# Patient Record
Sex: Female | Born: 2000 | Race: White | Hispanic: No | Marital: Single | Smoking: Never smoker
Health system: Southern US, Community
[De-identification: ages and names within clinical notes are randomized; demographics above are authoritative.]

## PROBLEM LIST (undated history)

## (undated) HISTORY — PX: JOINT REPLACEMENT: SHX530

---

## 2019-01-06 ENCOUNTER — Other Ambulatory Visit: Payer: Self-pay | Admitting: *Deleted

## 2019-01-06 DIAGNOSIS — Z20822 Contact with and (suspected) exposure to covid-19: Secondary | ICD-10-CM

## 2019-01-07 LAB — NOVEL CORONAVIRUS, NAA: SARS-CoV-2, NAA: NOT DETECTED

## 2019-07-31 ENCOUNTER — Ambulatory Visit
Admission: RE | Admit: 2019-07-31 | Discharge: 2019-07-31 | Disposition: A | Discharge status: Home / Self Care | Payer: PRIVATE HEALTH INSURANCE | Source: Ambulatory Visit | Attending: Sports Medicine | Admitting: Sports Medicine

## 2019-07-31 ENCOUNTER — Other Ambulatory Visit: Payer: Self-pay | Admitting: Sports Medicine

## 2019-07-31 ENCOUNTER — Other Ambulatory Visit: Payer: Self-pay

## 2019-07-31 DIAGNOSIS — R609 Edema, unspecified: Secondary | ICD-10-CM

## 2019-07-31 DIAGNOSIS — R52 Pain, unspecified: Secondary | ICD-10-CM

## 2019-07-31 DIAGNOSIS — M25571 Pain in right ankle and joints of right foot: Secondary | ICD-10-CM | POA: Diagnosis present

## 2020-04-20 HISTORY — PX: ANKLE ARTHROSCOPY: SHX545

## 2021-02-22 ENCOUNTER — Other Ambulatory Visit: Payer: Self-pay

## 2021-02-22 DIAGNOSIS — N390 Urinary tract infection, site not specified: Secondary | ICD-10-CM | POA: Insufficient documentation

## 2021-02-22 DIAGNOSIS — R109 Unspecified abdominal pain: Secondary | ICD-10-CM | POA: Diagnosis present

## 2021-02-22 LAB — CBC
HCT: 36.1 % (ref 36.0–46.0)
Hemoglobin: 12.2 g/dL (ref 12.0–15.0)
MCH: 29.5 pg (ref 26.0–34.0)
MCHC: 33.8 g/dL (ref 30.0–36.0)
MCV: 87.2 fL (ref 80.0–100.0)
Platelets: 291 10*3/uL (ref 150–400)
RBC: 4.14 MIL/uL (ref 3.87–5.11)
RDW: 12.5 % (ref 11.5–15.5)
WBC: 12.1 10*3/uL — ABNORMAL HIGH (ref 4.0–10.5)
nRBC: 0 % (ref 0.0–0.2)

## 2021-02-22 NOTE — ED Triage Notes (Signed)
Patient c/o lower abdominal pain, constant urge to urinate, and some constipation (last BM today).

## 2021-02-23 ENCOUNTER — Emergency Department: Payer: 59

## 2021-02-23 ENCOUNTER — Emergency Department
Admission: EM | Admit: 2021-02-23 | Discharge: 2021-02-23 | Disposition: A | Discharge status: Home / Self Care | Payer: 59 | Attending: Emergency Medicine | Admitting: Emergency Medicine

## 2021-02-23 DIAGNOSIS — R109 Unspecified abdominal pain: Secondary | ICD-10-CM

## 2021-02-23 DIAGNOSIS — N390 Urinary tract infection, site not specified: Secondary | ICD-10-CM

## 2021-02-23 LAB — URINALYSIS, ROUTINE W REFLEX MICROSCOPIC
Bilirubin Urine: NEGATIVE
Glucose, UA: NEGATIVE mg/dL
Ketones, ur: NEGATIVE mg/dL
Nitrite: NEGATIVE
Protein, ur: 100 mg/dL — AB
RBC / HPF: >50 RBC/hpf — ABNORMAL HIGH (ref 0–5)
Specific Gravity, Urine: 1.015 (ref 1.005–1.030)
WBC, UA: >50 WBC/hpf — ABNORMAL HIGH (ref 0–5)
pH: 7 (ref 5.0–8.0)

## 2021-02-23 LAB — COMPREHENSIVE METABOLIC PANEL
ALT: 15 U/L (ref 0–44)
AST: 23 U/L (ref 15–41)
Albumin: 3.8 g/dL (ref 3.5–5.0)
Alkaline Phosphatase: 37 U/L — ABNORMAL LOW (ref 38–126)
Anion gap: 9 (ref 5–15)
BUN: 13 mg/dL (ref 6–20)
CO2: 25 mmol/L (ref 22–32)
Calcium: 9.5 mg/dL (ref 8.9–10.3)
Chloride: 104 mmol/L (ref 98–111)
Creatinine, Ser: 0.77 mg/dL (ref 0.44–1.00)
GFR, Estimated: >60 mL/min (ref >60)
Glucose, Bld: 101 mg/dL — ABNORMAL HIGH (ref 70–99)
Potassium: 4 mmol/L (ref 3.5–5.1)
Sodium: 138 mmol/L (ref 135–145)
Total Bilirubin: 0.4 mg/dL (ref 0.3–1.2)
Total Protein: 7.7 g/dL (ref 6.5–8.1)

## 2021-02-23 LAB — POC URINE PREG, ED: Preg Test, Ur: NEGATIVE

## 2021-02-23 LAB — LIPASE, BLOOD: Lipase: 35 U/L (ref 11–51)

## 2021-02-23 MED ORDER — PHENAZOPYRIDINE HCL 95 MG PO TABS: 95.0000 mg | ORAL_TABLET | Freq: Three times a day (TID) | ORAL | 0 refills | Status: DC | PRN

## 2021-02-23 MED ORDER — IBUPROFEN 800 MG PO TABS: 800.0000 mg | ORAL_TABLET | Freq: Three times a day (TID) | ORAL | 0 refills | Status: DC | PRN

## 2021-02-23 MED ORDER — KETOROLAC TROMETHAMINE 30 MG/ML IJ SOLN
30.0000 mg | Freq: Once | INTRAMUSCULAR | Status: AC
  Administered 2021-02-23: 30 mg via INTRAVENOUS
  Filled 2021-02-23: qty 1

## 2021-02-23 MED ORDER — SODIUM CHLORIDE 0.9 % IV BOLUS (SEPSIS)
1000.0000 mL | Freq: Once | INTRAVENOUS | Status: AC
  Administered 2021-02-23: 1000 mL via INTRAVENOUS

## 2021-02-23 MED ORDER — IOHEXOL 300 MG/ML  SOLN: 80.0000 mL | Freq: Once | INTRAMUSCULAR | Status: DC | PRN

## 2021-02-23 MED ORDER — SODIUM CHLORIDE 0.9 % IV SOLN
1.0000 g | Freq: Once | INTRAVENOUS | Status: AC
  Administered 2021-02-23: 1 g via INTRAVENOUS
  Filled 2021-02-23: qty 10

## 2021-02-23 MED ORDER — ONDANSETRON 4 MG PO TBDP: 4.0000 mg | ORAL_TABLET | Freq: Four times a day (QID) | ORAL | 0 refills | Status: DC | PRN

## 2021-02-23 MED ORDER — IOHEXOL 300 MG/ML  SOLN
80.0000 mL | Freq: Once | INTRAMUSCULAR | Status: AC | PRN
  Administered 2021-02-23: 80 mL via INTRAVENOUS

## 2021-02-23 MED ORDER — TRAMADOL HCL 50 MG PO TABS: 50.0000 mg | ORAL_TABLET | Freq: Four times a day (QID) | ORAL | 0 refills | Status: AC | PRN

## 2021-02-23 MED ORDER — CEPHALEXIN 500 MG PO CAPS: 500.0000 mg | ORAL_CAPSULE | Freq: Three times a day (TID) | ORAL | 0 refills | Status: AC

## 2021-02-23 NOTE — ED Notes (Signed)
Patient transported to CT 

## 2021-02-23 NOTE — Discharge Instructions (Addendum)
You may alternate Tylenol 1000 mg every 6 hours as needed for pain, fever and Ibuprofen 800 mg every 8 hours as needed for pain, fever.  Please take Ibuprofen with food.  Do not take more than 4000 mg of Tylenol (acetaminophen) in a 24 hour period.   You are being provided a prescription for opiates (also known as narcotics) for pain control.  Opiates can be addictive and should only be used when absolutely necessary for pain control when other alternatives do not work.  We recommend you only use them for the recommended amount of time and only as prescribed.  Please do not take with other sedative medications or alcohol.  Please do not drive, operate machinery, make important decisions while taking opiates.  Please note that these medications can be addictive and have high abuse potential.  Patients can become addicted to narcotics after only taking them for a few days.  Please keep these medications locked away from children, teenagers or any family members with history of substance abuse.  Narcotic pain medicine may also make you constipated.  You may use over-the-counter medications such as MiraLAX, Colace to prevent constipation.  If you become constipated you may use over-the-counter enemas as needed.  Itching and nausea are common side effects of narcotic pain medication.  If you develop uncontrolled vomiting or a rash, please stop these medications.    Steps to find a Primary Care Provider (PCP):  Call [phone] or [phone] to access "[place] Find a Doctor Service."  2.  You may also go on the [facility] website at [email or web address]

## 2021-02-23 NOTE — ED Provider Notes (Signed)
[facility] Emergency Department Provider Note  ____________________________________________   Event Date/Time   First MD Initiated Contact with Patient 02/23/21 0601     (approximate)  I have reviewed the triage vital signs and the nursing notes.   HISTORY  Chief Complaint Abdominal Pain    HPI Lindsey Ryan is a 20 y.o. female with no significant past medical history who presents to the emergency department with complaints of intermittent, sharp and severe right sided abdominal pain.  Symptoms started yesterday.  She denies any fevers, nausea, vomiting, diarrhea.  She has had some dysuria.  No current vaginal bleeding or discharge.  No previous abdominal surgeries.  No history of kidney stones.        No past medical history on file.  There are no problems to display for this patient.   Past Surgical History:  Procedure Laterality Date   JOINT REPLACEMENT      Prior to Admission medications   Medication Sig Start Date End Date Taking? Authorizing Provider  cephALEXin (KEFLEX) 500 MG capsule Take 1 capsule (500 mg total) by mouth 3 (three) times daily for 7 days. 02/23/21 03/02/21 Yes Cashmere Dingley N, DO  ibuprofen (ADVIL) 800 MG tablet Take 1 tablet (800 mg total) by mouth every 8 (eight) hours as needed for mild pain. 02/23/21  Yes Artha Stavros N, DO  ondansetron (ZOFRAN ODT) 4 MG disintegrating tablet Take 1 tablet (4 mg total) by mouth every 6 (six) hours as needed for nausea or vomiting. 02/23/21  Yes Lavanda Nevels, Layla Maw, DO  phenazopyridine (PYRIDIUM) 95 MG tablet Take 1 tablet (95 mg total) by mouth 3 (three) times daily as needed for pain. 02/23/21  Yes Armida Vickroy, Layla Maw, DO  traMADol (ULTRAM) 50 MG tablet Take 1 tablet (50 mg total) by mouth every 6 (six) hours as needed. 02/23/21 02/23/22 Yes Ohanna Gassert, Layla Maw, DO    Allergies Patient has no known allergies.  No family history on file.  Social History Social History   Tobacco Use    Smoking status: Never   Smokeless tobacco: Never  Substance Use Topics   Alcohol use: Yes   Drug use: Never    Review of Systems Constitutional: No fever. Eyes: No visual changes. ENT: No sore throat. Cardiovascular: Denies chest pain. Respiratory: Denies shortness of breath. Gastrointestinal: No nausea, vomiting, diarrhea. Genitourinary: + for dysuria. Musculoskeletal: Negative for back pain. Skin: Negative for rash. Neurological: Negative for focal weakness or numbness.  ____________________________________________   PHYSICAL EXAM:  VITAL SIGNS: ED Triage Vitals  Enc Vitals Group     BP 02/22/21 2335 132/77     Pulse Rate 02/22/21 2335 86     Resp 02/22/21 2335 17     Temp 02/22/21 2335 98.8 F (37.1 C)     Temp Source 02/22/21 2335 Oral     SpO2 02/22/21 2335 97 %     Weight 02/22/21 2336 154 lb 5.2 oz (70 kg)     Height 02/22/21 2336 5\' 11"  (1.803 m)     Head Circumference --      Peak Flow --      Pain Score 02/22/21 2335 8     Pain Loc --      Pain Edu? --      Excl. in GC? --    CONSTITUTIONAL: Alert and oriented and responds appropriately to questions. Well-appearing; well-nourished HEAD: Normocephalic EYES: Conjunctivae clear, pupils appear equal, EOM appear intact ENT: normal nose; moist mucous membranes NECK: Supple, normal  ROM CARD: RRR; S1 and S2 appreciated; no murmurs, no clicks, no rubs, no gallops RESP: Normal chest excursion without splinting or tachypnea; breath sounds clear and equal bilaterally; no wheezes, no rhonchi, no rales, no hypoxia or respiratory distress, speaking full sentences ABD/GI: Normal bowel sounds; non-distended; soft, Tender to palpation in the right mid and lower abdomen without guarding or rebound BACK: The back appears normal EXT: Normal ROM in all joints; no deformity noted, no edema; no cyanosis SKIN: Normal color for age and race; warm; no rash on exposed skin NEURO: Moves all extremities equally PSYCH: The patient's  mood and manner are appropriate.  ____________________________________________   LABS (all labs ordered are listed, but only abnormal results are displayed)  Labs Reviewed  COMPREHENSIVE METABOLIC PANEL - Abnormal; Notable for the following components:      Result Value   Glucose, Bld 101 (*)    Alkaline Phosphatase 37 (*)    All other components within normal limits  CBC - Abnormal; Notable for the following components:   WBC 12.1 (*)    All other components within normal limits  URINALYSIS, ROUTINE W REFLEX MICROSCOPIC - Abnormal; Notable for the following components:   Color, Urine YELLOW (*)    APPearance CLOUDY (*)    Hgb urine dipstick MODERATE (*)    Protein, ur 100 (*)    Leukocytes,Ua LARGE (*)    RBC / HPF >50 (*)    WBC, UA >50 (*)    Bacteria, UA RARE (*)    All other components within normal limits  URINE CULTURE  LIPASE, BLOOD  POC URINE PREG, ED   ____________________________________________  EKG   ____________________________________________  RADIOLOGY I, Thanos Cousineau, personally viewed and evaluated these images (plain radiographs) as part of my medical decision making, as well as reviewing the written report by the radiologist.  ED MD interpretation: Normal appendix.  No kidney stone or pyelonephritis.  Official radiology report(s): CT ABDOMEN PELVIS W CONTRAST  Result Date: 02/23/2021 CLINICAL DATA:  20 year old female with acute lower abdominal pain. Urinary urgency. EXAM: CT ABDOMEN AND PELVIS WITH CONTRAST TECHNIQUE: Multidetector CT imaging of the abdomen and pelvis was performed using the standard protocol following bolus administration of intravenous contrast. CONTRAST:  12mL OMNIPAQUE IOHEXOL 300 MG/ML  SOLN COMPARISON:  None. FINDINGS: Lower chest: Negative. Hepatobiliary: Negative liver and gallbladder. Pancreas: Negative. Spleen: Negative. Adrenals/Urinary Tract: Normal adrenal glands. Kidneys appear symmetric and normal. No nephrolithiasis or  pararenal inflammation. Proximal ureters are decompressed. Unremarkable bladder. Stomach/Bowel: No dilated large or small bowel. Redundant transverse colon and mild retained stool throughout the large bowel. Normal appendix visible on coronal image 34. Negative adjacent terminal ileum. Fluid-filled but nondilated small bowel in the pelvis. Decompressed stomach and duodenum. No free air, free fluid, mesenteric inflammation. Vascular/Lymphatic: Major arterial structures in the abdomen and pelvis and the portal venous system appear to be patent and normal. No lymphadenopathy. Reproductive: Within normal limits. Other: No pelvic free fluid. Musculoskeletal: Negative. IMPRESSION: Negative. Normal appendix. No acute or inflammatory process identified in the abdomen or pelvis. Electronically Signed   By: Odessa Fleming M.D.   On: 02/23/2021 07:20    ____________________________________________   PROCEDURES  Procedure(s) performed (including Critical Care):  Procedures   ____________________________________________   INITIAL IMPRESSION / ASSESSMENT AND PLAN / ED COURSE  As part of my medical decision making, I reviewed the following data within the electronic MEDICAL RECORD NUMBER Nursing notes reviewed and incorporated, Labs reviewed , Old chart reviewed, CT reviewed, and  Notes from prior ED visits         Patient here with complaints of right-sided abdominal pain.  Labs obtained from triage show mild leukocytosis of 12,000.  She does have a grossly infected urine.  Culture is pending.  Discussed with patient that symptoms could be due to a sending UTI but also concern for possible pyelonephritis, infected kidney stone as well as appendicitis based on her exam.  We will proceed with CT of the abdomen pelvis for further evaluation.  Will give IV fluids, Toradol, ceftriaxone.  ED PROGRESS  Patient CT scan shows no appendicitis, pyelonephritis, kidney stone.  She reports feeling better and is hemodynamically  stable.  Will discharge with prescriptions for ibuprofen, Azo, Keflex.  I feel she is safe for discharge home.   At this time, I do not feel there is any life-threatening condition present. I have reviewed, interpreted and discussed all results (EKG, imaging, lab, urine as appropriate) and exam findings with patient/family. I have reviewed nursing notes and appropriate previous records.  I feel the patient is safe to be discharged home without further emergent workup and can continue workup as an outpatient as needed. Discussed usual and customary return precautions. Patient/family verbalize understanding and are comfortable with this plan.  Outpatient follow-up has been provided as needed. All questions have been answered.  ____________________________________________   FINAL CLINICAL IMPRESSION(S) / ED DIAGNOSES  Final diagnoses:  Right sided abdominal pain  Acute UTI     ED Discharge Orders          Ordered    ibuprofen (ADVIL) 800 MG tablet  Every 8 hours PRN        02/23/21 0731    phenazopyridine (PYRIDIUM) 95 MG tablet  3 times daily PRN        02/23/21 0731    ondansetron (ZOFRAN ODT) 4 MG disintegrating tablet  Every 6 hours PRN        02/23/21 0731    cephALEXin (KEFLEX) 500 MG capsule  3 times daily        02/23/21 0731    traMADol (ULTRAM) 50 MG tablet  Every 6 hours PRN        02/23/21 0731            *Please note:  Livy Hertz-Saebboe was evaluated in Emergency Department on 02/23/2021 for the symptoms described in the history of present illness. She was evaluated in the context of the global COVID-19 pandemic, which necessitated consideration that the patient might be at risk for infection with the SARS-CoV-2 virus that causes COVID-19. Institutional protocols and algorithms that pertain to the evaluation of patients at risk for COVID-19 are in a state of rapid change based on information released by regulatory bodies including the CDC and federal and state  organizations. These policies and algorithms were followed during the patient's care in the ED.  Some ED evaluations and interventions may be delayed as a result of limited staffing during and the pandemic.*   Note:  This document was prepared using Dragon voice recognition software and may include unintentional dictation errors.    Jasier Calabretta, Layla Maw, DO 02/23/21 [phone]

## 2021-02-25 LAB — URINE CULTURE: Culture: >=100000 — AB

## 2022-05-18 ENCOUNTER — Encounter: Payer: Self-pay | Admitting: Medical

## 2022-05-18 ENCOUNTER — Ambulatory Visit (INDEPENDENT_AMBULATORY_CARE_PROVIDER_SITE_OTHER): Payer: BC Managed Care – PPO | Admitting: Medical

## 2022-05-18 ENCOUNTER — Other Ambulatory Visit: Payer: Self-pay

## 2022-05-18 VITALS — BP 103/73 | HR 83 | Temp 98.1°F | Ht 70.08 in | Wt 163.0 lb

## 2022-05-18 DIAGNOSIS — Z124 Encounter for screening for malignant neoplasm of cervix: Secondary | ICD-10-CM | POA: Diagnosis not present

## 2022-05-18 DIAGNOSIS — Z113 Encounter for screening for infections with a predominantly sexual mode of transmission: Secondary | ICD-10-CM

## 2022-05-18 NOTE — Progress Notes (Signed)
[address] [place], [place] [address] Phone: [phone] Fax: [phone]   Office Visit Note  Patient Name: Lindsey Ryan  Date of Birth:04-28-2000  Med Rec number [id number]  Date of Service: 05/18/2022  Allergies: Patient has no known allergies.  Chief Complaint  Patient presents with   Gynecologic Exam     HPI 22 y.o. college student presents for pap smear and STI screening.  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  Completed an at-home vaginal self swab in December provided by [employer].  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  Has been sexually active with males in past, has not always used condoms. Recalls receiving HPV vaccines as an adolescent. Would also like STI screening today.  She is not sexually active currently.  Patient denies other concerns today. Gets regular periods, no birth control currently.  Fhx of breast cancer in [place].  No family history of other gynecological cancer.   Current Medication:  Outpatient Encounter Medications as of 05/18/2022  Medication Sig   [DISCONTINUED] ibuprofen (ADVIL) 800 MG tablet Take 1 tablet (800 mg total) by mouth every 8 (eight) hours as needed for mild pain. (Patient not taking: Reported on 05/18/2022)   [DISCONTINUED] ondansetron (ZOFRAN ODT) 4 MG disintegrating tablet Take 1 tablet (4 mg total) by mouth every 6 (six) hours as needed for nausea or vomiting. (Patient not taking: Reported on 05/18/2022)   [DISCONTINUED] phenazopyridine (PYRIDIUM) 95 MG tablet Take 1 tablet (95 mg total) by mouth 3 (three) times daily as needed for pain. (Patient not taking: Reported on 05/18/2022)   No facility-administered encounter medications on file as of 05/18/2022.      Medical History: History reviewed. No pertinent past medical history.  Past Surgical History:  Procedure Laterality Date    ANKLE ARTHROSCOPY Right 04/2020     Vital Signs: BP 103/73   Pulse 83   Temp 98.1 F (36.7 C) (Tympanic)   Ht 5' 10.08" (1.78 m)   Wt 163 lb (73.9 kg)   SpO2 98%   BMI 23.34 kg/m    Review of Systems  Genitourinary:  Negative for dysuria, flank pain, frequency, menstrual problem, pelvic pain, urgency, vaginal discharge and vaginal pain.    Physical Exam Vitals reviewed. Exam conducted with a chaperone present.  Constitutional:      General: She is not in acute distress.    Appearance: She is not ill-appearing.  Genitourinary:    General: Normal vulva.     Exam position: Lithotomy position.     Labia:        Right: No rash, tenderness or lesion.        Left: No rash, tenderness or lesion.      Vagina: Vaginal discharge (small white, physiologic appearing) present. No erythema, tenderness, bleeding or lesions.     Cervix: Friability (slightly) and erythema (slight) present. No discharge, lesion or cervical bleeding.  Neurological:     Mental Status: She is alert.     Assessment/Plan: 1. Encounter for Papanicolaou smear for cervical cancer screening 2. Screening examination for STD (sexually transmitted disease) Pap smear done. Will also test sample for Chlamydia and Gonorrhea. Blood drawn for HIV and Syphilis screening. Patient encouraged to register for MyChart so she can receive copies of her results.  - Pap IG and Chlamydia/Gonococcus, NAA - HIV Antibody (routine testing w rflx) - RPR Qual  General Counseling: [name] verbalizes understanding of the findings of todays visit and agrees with plan of treatment. she has been encouraged to call the office with any questions or concerns that should arise related to todays visit.   Time spent: [place] PA-C [place] 05/18/2022 5:24 PM

## 2022-05-18 NOTE — Patient Instructions (Addendum)
-  You will receive a MyChart message notifying you of your lab results when they are available.  -Remember to use condoms every time you have sex to prevent sexually transmitted infections and pregnancy.

## 2022-05-19 LAB — RPR QUALITATIVE: RPR Ser Ql: NONREACTIVE

## 2022-05-19 LAB — HIV ANTIBODY (ROUTINE TESTING W REFLEX): HIV Screen 4th Generation wRfx: NONREACTIVE

## 2022-05-20 LAB — PAP IG AND CT-NG NAA
Chlamydia, Nuc. Acid Amp: NEGATIVE
Gonococcus by Nucleic Acid Amp: NEGATIVE

## 2022-07-08 IMAGING — CT CT ABD-PELV W/ CM
2 of 4 series · 16 of 46 positions shown, 18 images · IV contrast (APPLIED)
Comparison: None.

CLINICAL DATA: 20-year-old female with acute lower abdominal pain.
Urinary urgency.

EXAM:
CT ABDOMEN AND PELVIS WITH CONTRAST
TECHNIQUE: Multidetector CT imaging of the abdomen and pelvis was performed
using the standard protocol following bolus administration of
intravenous contrast.
CONTRAST:  80mL OMNIPAQUE IOHEXOL 300 MG/ML  SOLN

[Series 2: routine abd/pel with · axial · 0.70mm/px · z∈[-1041,-601]mm · 13 of 96 slices shown, 15 images]
[im 4/96  soft-tissue]
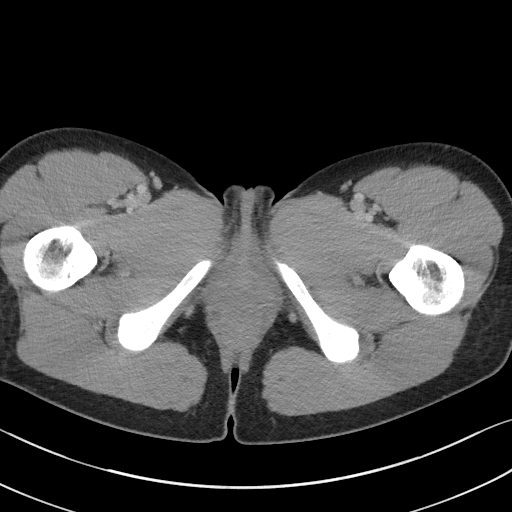
[im 4/96  bone]
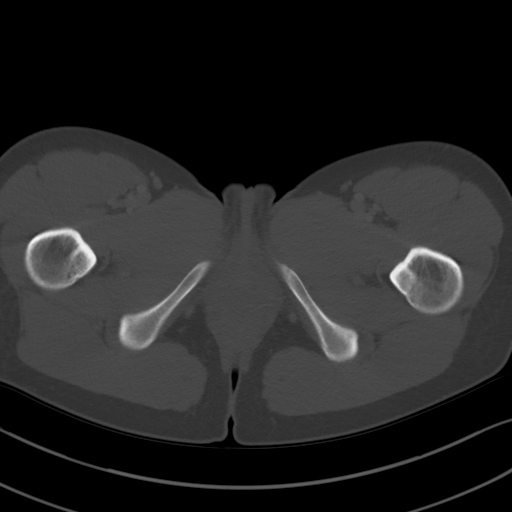
[im 12/96  soft-tissue]
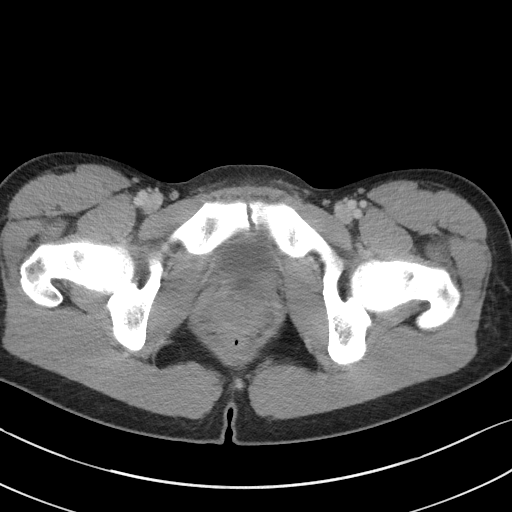
[im 20/96  soft-tissue]
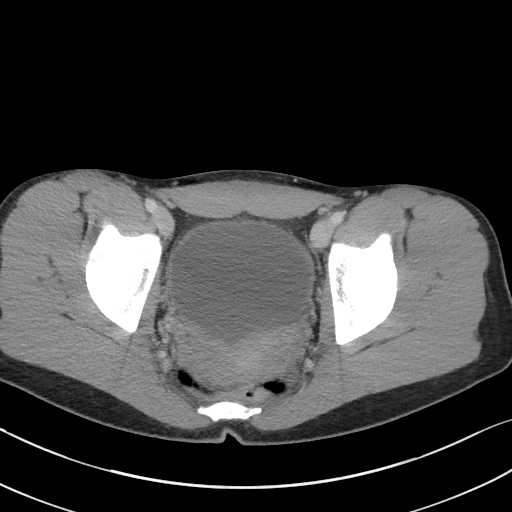
[im 28/96  soft-tissue]
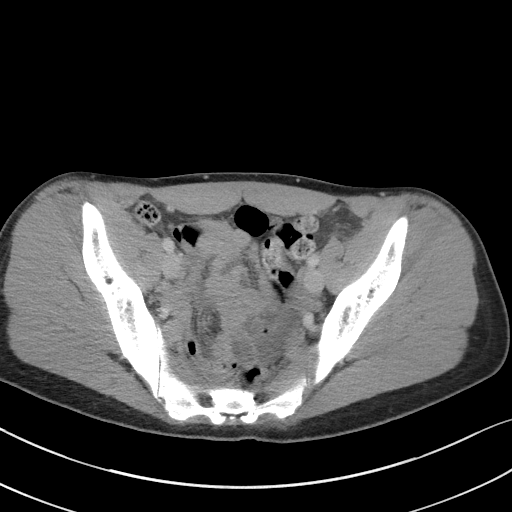
[im 32/96  soft-tissue]
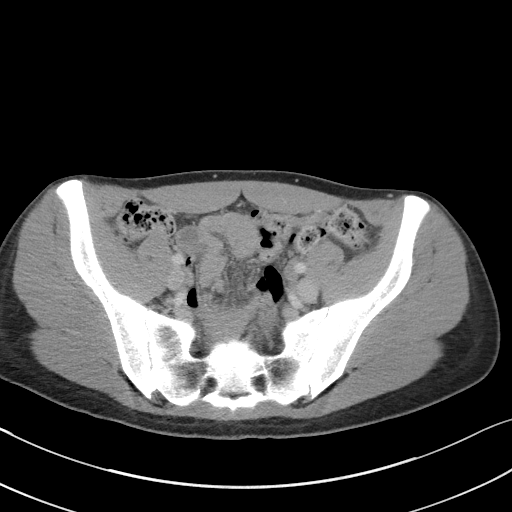
[im 40/96  soft-tissue]
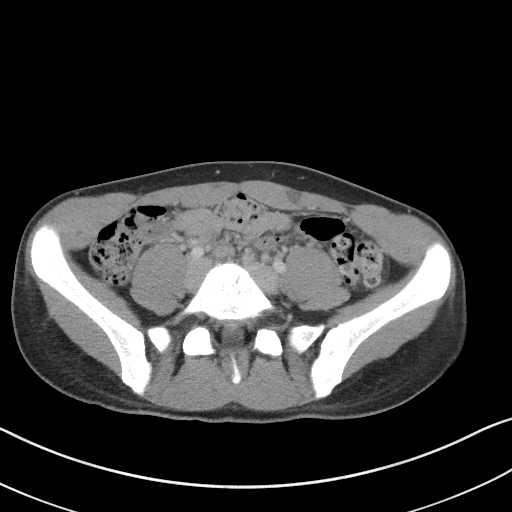
[im 48/96  soft-tissue]
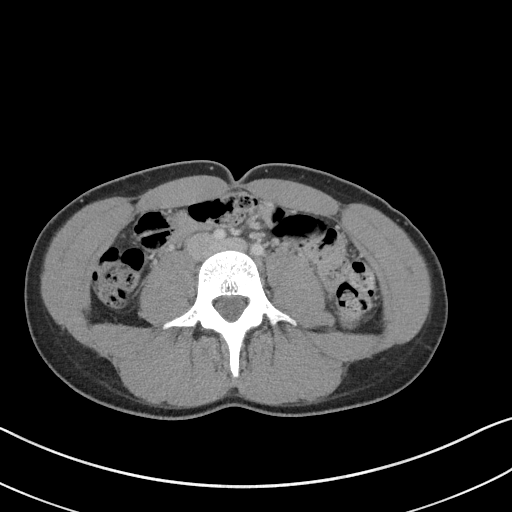
[im 56/96  soft-tissue]
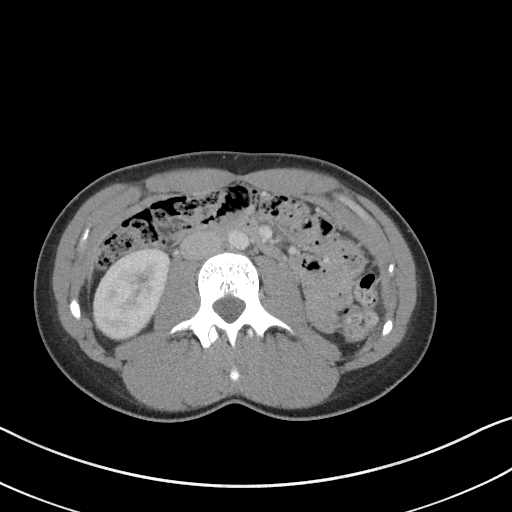
[im 64/96  soft-tissue]
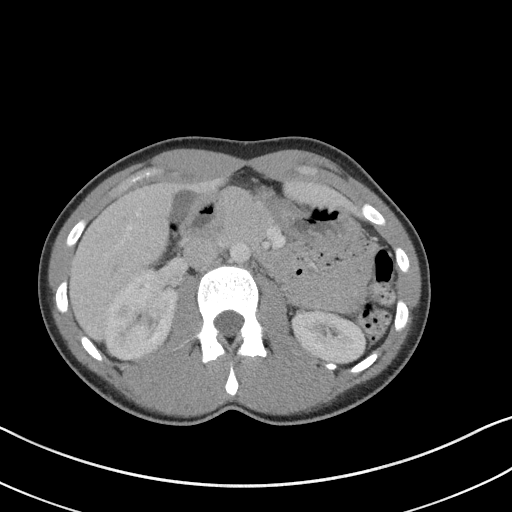
[im 64/96  bone]
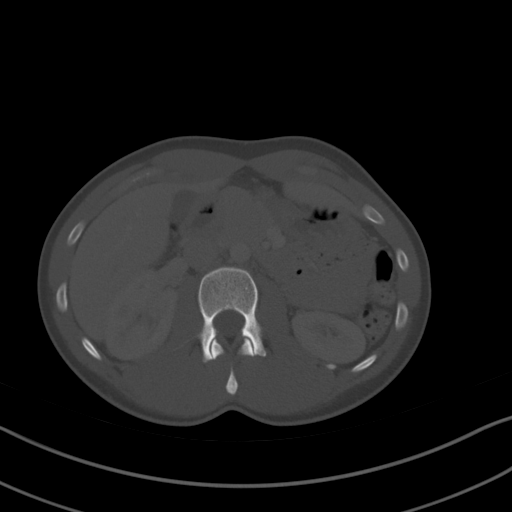
[im 68/96  soft-tissue]
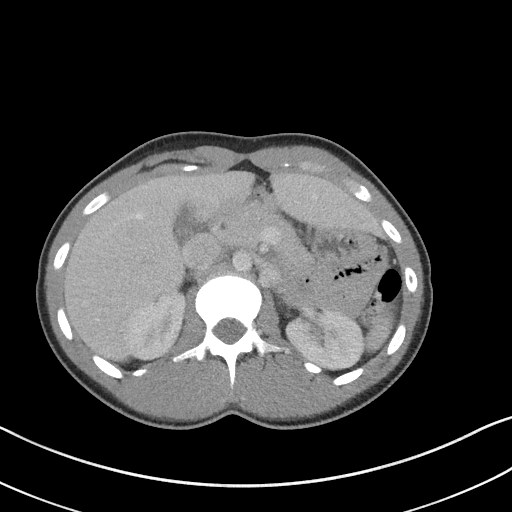
[im 76/96  soft-tissue]
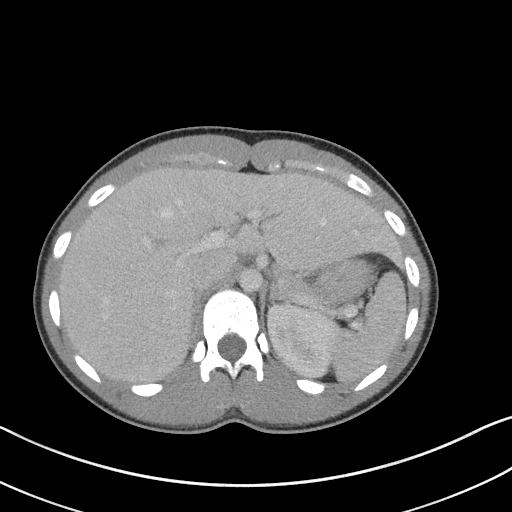
[im 84/96  soft-tissue]
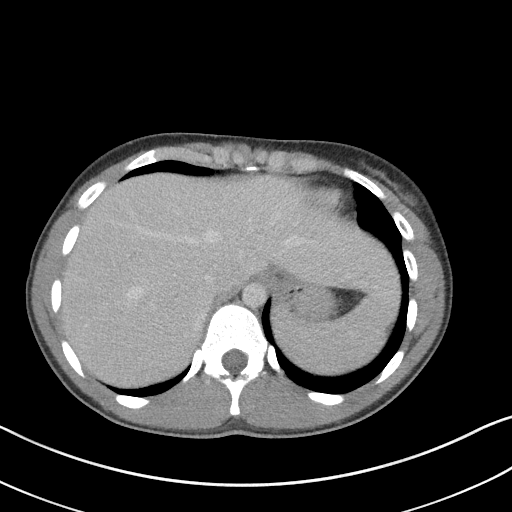
[im 92/96  soft-tissue]
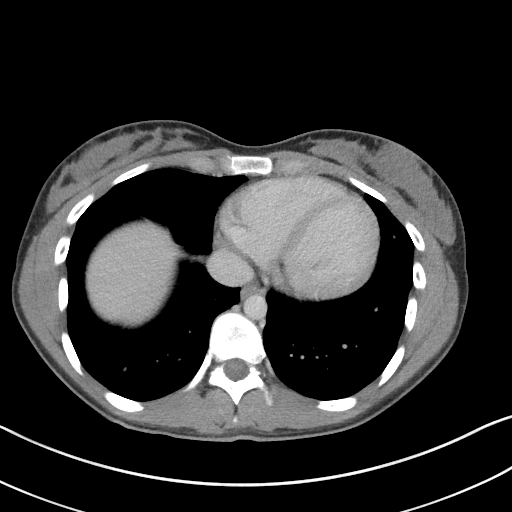

[Series 5: coronal st · coronal · 0.78mm/px · 3 of 78 slices shown]
[im 26/78  soft-tissue]
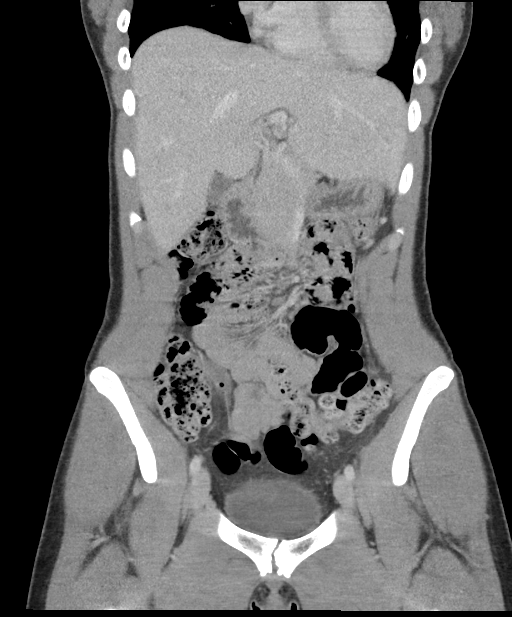
[im 35/78  soft-tissue]
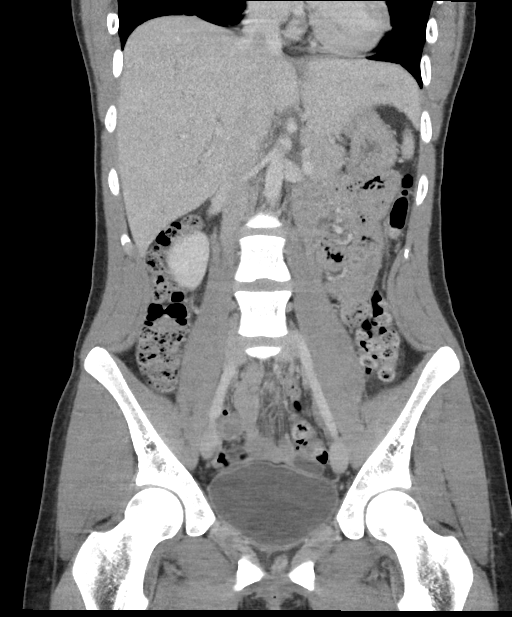
[im 43/78  soft-tissue]
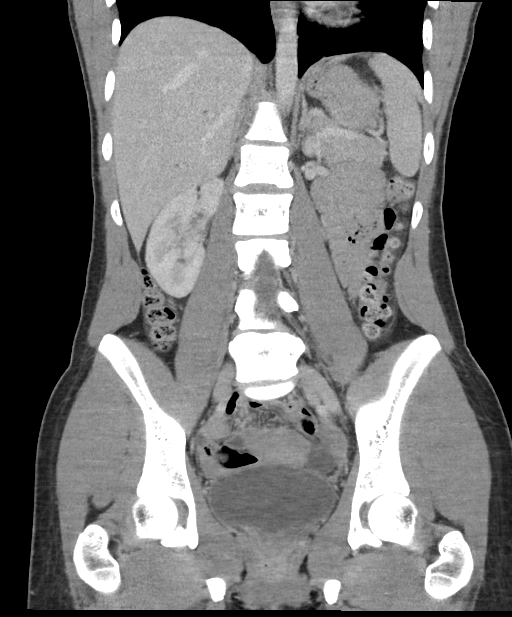

[16 of 46 positions shown; findings below may reference images not displayed]

FINDINGS: Lower chest: Negative.

Hepatobiliary: Negative liver and gallbladder.

Pancreas: Negative.

Spleen: Negative.

Adrenals/Urinary Tract: Normal adrenal glands. Kidneys appear
symmetric and normal. No nephrolithiasis or pararenal inflammation.
Proximal ureters are decompressed. Unremarkable bladder.

Stomach/Bowel: No dilated large or small bowel. Redundant transverse
colon and mild retained stool throughout the large bowel. Normal
appendix visible on coronal image 34. Negative adjacent terminal
ileum. Fluid-filled but nondilated small bowel in the pelvis.
Decompressed stomach and duodenum. No free air, free fluid,
mesenteric inflammation.

Vascular/Lymphatic: Major arterial structures in the abdomen and
pelvis and the portal venous system appear to be patent and normal.
No lymphadenopathy.

Reproductive: Within normal limits.

Other: No pelvic free fluid.

Musculoskeletal: Negative.
IMPRESSION: Negative. Normal appendix. No acute or inflammatory process
identified in the abdomen or pelvis.
